# Patient Record
Sex: Female | Born: 1980 | Race: White | Hispanic: No | Marital: Single | Smoking: Current every day smoker
Health system: Southern US, Community
[De-identification: ages and names within clinical notes are randomized; demographics above are authoritative.]

## PROBLEM LIST (undated history)

## (undated) HISTORY — PX: TUBAL LIGATION: SHX77

---

## 2021-04-22 ENCOUNTER — Emergency Department: Payer: Medicaid Other

## 2021-04-22 ENCOUNTER — Other Ambulatory Visit: Payer: Self-pay

## 2021-04-22 ENCOUNTER — Emergency Department
Admission: EM | Admit: 2021-04-22 | Discharge: 2021-04-22 | Disposition: A | Discharge status: Home / Self Care | Payer: Medicaid Other | Attending: Student in an Organized Health Care Education/Training Program | Admitting: Student in an Organized Health Care Education/Training Program

## 2021-04-22 DIAGNOSIS — S42209A Unspecified fracture of upper end of unspecified humerus, initial encounter for closed fracture: Secondary | ICD-10-CM

## 2021-04-22 DIAGNOSIS — Y92828 Other wilderness area as the place of occurrence of the external cause: Secondary | ICD-10-CM | POA: Insufficient documentation

## 2021-04-22 DIAGNOSIS — W010XXA Fall on same level from slipping, tripping and stumbling without subsequent striking against object, initial encounter: Secondary | ICD-10-CM | POA: Insufficient documentation

## 2021-04-22 DIAGNOSIS — S42292A Other displaced fracture of upper end of left humerus, initial encounter for closed fracture: Secondary | ICD-10-CM | POA: Insufficient documentation

## 2021-04-22 HISTORY — DX: Unspecified fracture of upper end of unspecified humerus, initial encounter for closed fracture: S42.209A

## 2021-04-22 MED ORDER — OXYCODONE-ACETAMINOPHEN 5-325 MG PO TABS
1.0000 | ORAL_TABLET | Freq: Once | ORAL | Status: AC
  Administered 2021-04-22: 1 via ORAL

## 2021-04-22 MED ORDER — FENTANYL CITRATE (PF) 250 MCG/5ML IJ SOLN
100.0000 ug | Freq: Once | INTRAMUSCULAR | Status: AC
  Administered 2021-04-22: 100 ug via INTRAVENOUS

## 2021-04-22 MED ORDER — OXYCODONE HCL 5 MG PO TABS: 5.0000 mg | ORAL_TABLET | Freq: Three times a day (TID) | ORAL | 0 refills | Status: AC | PRN

## 2021-04-22 MED ORDER — NAPROXEN 500 MG PO TABS: 500.0000 mg | ORAL_TABLET | Freq: Two times a day (BID) | ORAL | 0 refills | Status: AC

## 2021-04-22 MED ORDER — METHOCARBAMOL 500 MG PO TABS: 500.0000 mg | ORAL_TABLET | Freq: Three times a day (TID) | ORAL | 0 refills | Status: AC | PRN

## 2021-04-22 NOTE — ED Notes (Signed)
Patient transported to X-ray 

## 2021-04-22 NOTE — ED Triage Notes (Signed)
Pt to ED for fall this am, states landed on left elbow. Fall d/t slipping on hill. Obvious deformity noted to left shoulder.

## 2021-04-22 NOTE — Discharge Instructions (Signed)
Call today to request a follow up with orthopedics.  Take the medications as prescribed. You may also take Colace stool softener to prevent constipation if taking the oxycodone.  Wear the immobilizer until follow-up with orthopedics.  Return to the emergency department for symptoms that change or worsen if you are unable to see orthopedics right away.

## 2021-04-22 NOTE — ED Provider Notes (Signed)
[facility] Emergency Department Provider Note ____________________________________________  Time seen: Approximately 10:30 AM  I have reviewed the triage vital signs and the nursing notes.   HISTORY  Chief Complaint Shoulder Injury    HPI Hayley Robertson is a 40 y.o. female who presents to the emergency department for evaluation and treatment of left shoulder pain after slipping on a hill and landed on elbow. Intense pain in shoulder immediately afterward. No previous shoulder dislocation.  History reviewed. No pertinent past medical history.  There are no problems to display for this patient.   History reviewed. No pertinent surgical history.  Prior to Admission medications   Medication Sig Start Date End Date Taking? Authorizing Provider  methocarbamol (ROBAXIN) 500 MG tablet Take 1 tablet (500 mg total) by mouth every 8 (eight) hours as needed for muscle spasms. 04/22/21  Yes Zong Mcquarrie B, FNP  naproxen (NAPROSYN) 500 MG tablet Take 1 tablet (500 mg total) by mouth 2 (two) times daily with a meal. 04/22/21  Yes Roberth Berling B, FNP  oxyCODONE (ROXICODONE) 5 MG immediate release tablet Take 1 tablet (5 mg total) by mouth every 8 (eight) hours as needed. 04/22/21 04/22/22 Yes Kenedie Dirocco, Kasandra Knudsen, FNP    Allergies Ceclor [cefaclor] and Tegretol [carbamazepine]  No family history on file.  Social History    Review of Systems Constitutional: Negative for fever. Cardiovascular: Negative for chest pain. Respiratory: Negative for shortness of breath. Musculoskeletal: Positive for shoulder pain and deformity. Skin: Negative for open wounds or lesions.  Neurological: Negative for decrease in sensation  ____________________________________________   PHYSICAL EXAM:  VITAL SIGNS: ED Triage Vitals [04/22/21 1022]  Enc Vitals Group     BP (!) 137/102     Pulse Rate 96     Resp 20     Temp 98.4 F (36.9 C)     Temp Source Oral     SpO2 97 %      Weight 130 lb (59 kg)     Height 5\' 8"  (1.727 m)     Head Circumference      Peak Flow      Pain Score 10     Pain Loc      Pain Edu?      Excl. in GC?     Constitutional: Alert and oriented. Well appearing and in no acute distress. Eyes: Conjunctivae are clear without discharge or drainage Head: Atraumatic Neck: Supple. No focal midline tenderness Respiratory: No cough. Respirations are even and unlabored. Musculoskeletal: Obvious anterior dislocation left shoulder Neurologic: Motor and sensory function left hand intact.  Skin: No open wounds over the left shoulder.   Psychiatric: Affect and behavior are appropriate.  ____________________________________________   LABS (all labs ordered are listed, but only abnormal results are displayed)  Labs Reviewed - No data to display ____________________________________________  RADIOLOGY  Image of the left shoulder shows a comminuted proximal humerus fracture just distal to the humeral neck  I, Nura Cahoon, personally viewed and evaluated these images (plain radiographs) as part of my medical decision making, as well as reviewing the written report by the radiologist.  DG Shoulder Left  Result Date: 04/22/2021 CLINICAL DATA:  40 year old who slipped and fell outside on a Hill earlier today, injuring the LEFT shoulder. EXAM: LEFT SHOULDER - 2+ VIEW COMPARISON:  None. FINDINGS: Comminuted fracture involving the proximal humerus at and just distal to the humeral neck. Glenohumeral joint anatomically aligned. Acromioclavicular joint anatomically aligned. IMPRESSION: Comminuted fracture involving the proximal humerus. Electronically  Signed   By: Hulan Saas M.D.   On: 04/22/2021 11:01   ____________________________________________   PROCEDURES  Procedures placed in shoulder immobilizer by nursing staff.  Neurovascularly intact after placement.  ____________________________________________   INITIAL IMPRESSION / ASSESSMENT  AND PLAN / ED COURSE  Hayley Robertson is a 40 y.o. who presents to the emergency department for treatment and evaluation of left shoulder pain after mechanical, nonsyncopal fall just prior to arrival.  See HPI for details.  Suspect anterior shoulder dislocation.  Will get imaging.  No dislocation.  She does have a comminuted proximal humerus fracture just distal to the humeral neck.  Case discussed with Dr.Poggi who was able to review the x-ray image and recommend shoulder immobilizer and outpatient follow-up.  Results were discussed with the patient.  Shoulder immobilizer applied and patient advised to wear this until Ortho follow-up.  Plan will be to have her call this afternoon to request a follow-up appointment.  Pain medications, anti-inflammatory, and muscle relaxer sent to patient's pharmacy.  She was encouraged to return to the emergency department if symptoms change or pain is uncontrolled despite medications.  Medications  fentaNYL citrate (PF) (SUBLIMAZE) injection 100 mcg (100 mcg Intravenous Given 04/22/21 1057)  oxyCODONE-acetaminophen (PERCOCET/ROXICET) 5-325 MG per tablet 1 tablet (1 tablet Oral Given 04/22/21 1211)    Pertinent labs & imaging results that were available during my care of the patient were reviewed by me and considered in my medical decision making (see chart for details).   _________________________________________   Final diagnoses:  Other closed displaced fracture of proximal end of left humerus, initial encounter    Initial fracture care was provided. Follow up will be greater than 24 hours.  ED Discharge Orders          Ordered    oxyCODONE (ROXICODONE) 5 MG immediate release tablet  Every 8 hours PRN        04/22/21 1142    naproxen (NAPROSYN) 500 MG tablet  2 times daily with meals        04/22/21 1142    methocarbamol (ROBAXIN) 500 MG tablet  Every 8 hours PRN        04/22/21 1142             If controlled substance prescribed during this  visit, 12 month history viewed on the [facility] prior to issuing an initial prescription for Schedule II or III opiod.    Chinita Pester, FNP 04/22/21 1234    Willy Eddy, MD 04/22/21 [phone]

## 2021-04-28 ENCOUNTER — Other Ambulatory Visit (HOSPITAL_COMMUNITY): Payer: Self-pay | Admitting: Orthopaedic Surgery

## 2021-04-28 ENCOUNTER — Other Ambulatory Visit: Payer: Self-pay | Admitting: Orthopaedic Surgery

## 2021-04-28 DIAGNOSIS — S42202A Unspecified fracture of upper end of left humerus, initial encounter for closed fracture: Secondary | ICD-10-CM

## 2021-04-29 ENCOUNTER — Other Ambulatory Visit: Payer: Self-pay

## 2021-04-29 ENCOUNTER — Ambulatory Visit
Admission: RE | Admit: 2021-04-29 | Discharge: 2021-04-29 | Disposition: A | Discharge status: Home / Self Care | Payer: BC Managed Care – PPO | Source: Ambulatory Visit | Attending: Orthopaedic Surgery | Admitting: Orthopaedic Surgery

## 2021-04-29 DIAGNOSIS — S42202A Unspecified fracture of upper end of left humerus, initial encounter for closed fracture: Secondary | ICD-10-CM | POA: Insufficient documentation

## 2021-08-22 ENCOUNTER — Emergency Department: Payer: BC Managed Care – PPO

## 2021-08-22 ENCOUNTER — Emergency Department
Admission: EM | Admit: 2021-08-22 | Discharge: 2021-08-22 | Disposition: A | Discharge status: Home / Self Care | Payer: BC Managed Care – PPO | Attending: Emergency Medicine | Admitting: Emergency Medicine

## 2021-08-22 ENCOUNTER — Encounter: Payer: Self-pay | Admitting: Emergency Medicine

## 2021-08-22 DIAGNOSIS — R112 Nausea with vomiting, unspecified: Secondary | ICD-10-CM | POA: Diagnosis present

## 2021-08-22 DIAGNOSIS — R1030 Lower abdominal pain, unspecified: Secondary | ICD-10-CM | POA: Insufficient documentation

## 2021-08-22 DIAGNOSIS — R197 Diarrhea, unspecified: Secondary | ICD-10-CM | POA: Insufficient documentation

## 2021-08-22 DIAGNOSIS — R001 Bradycardia, unspecified: Secondary | ICD-10-CM | POA: Insufficient documentation

## 2021-08-22 LAB — COMPREHENSIVE METABOLIC PANEL
ALT: 13 U/L (ref 0–44)
AST: 29 U/L (ref 15–41)
Albumin: 4 g/dL (ref 3.5–5.0)
Alkaline Phosphatase: 70 U/L (ref 38–126)
Anion gap: 13 (ref 5–15)
BUN: <5 mg/dL — ABNORMAL LOW (ref 6–20)
CO2: 21 mmol/L — ABNORMAL LOW (ref 22–32)
Calcium: 8.7 mg/dL — ABNORMAL LOW (ref 8.9–10.3)
Chloride: 105 mmol/L (ref 98–111)
Creatinine, Ser: 0.63 mg/dL (ref 0.44–1.00)
GFR, Estimated: >60 mL/min (ref >60)
Glucose, Bld: 134 mg/dL — ABNORMAL HIGH (ref 70–99)
Potassium: 3.3 mmol/L — ABNORMAL LOW (ref 3.5–5.1)
Sodium: 139 mmol/L (ref 135–145)
Total Bilirubin: 0.7 mg/dL (ref 0.3–1.2)
Total Protein: 6.6 g/dL (ref 6.5–8.1)

## 2021-08-22 LAB — CBC WITH DIFFERENTIAL/PLATELET
Abs Immature Granulocytes: 0.06 10*3/uL (ref 0.00–0.07)
Basophils Absolute: 0.1 10*3/uL (ref 0.0–0.1)
Basophils Relative: 1 %
Eosinophils Absolute: 0.3 10*3/uL (ref 0.0–0.5)
Eosinophils Relative: 2 %
HCT: 44.4 % (ref 36.0–46.0)
Hemoglobin: 15.2 g/dL — ABNORMAL HIGH (ref 12.0–15.0)
Immature Granulocytes: 1 %
Lymphocytes Relative: 21 %
Lymphs Abs: 2.3 10*3/uL (ref 0.7–4.0)
MCH: 31.9 pg (ref 26.0–34.0)
MCHC: 34.2 g/dL (ref 30.0–36.0)
MCV: 93.1 fL (ref 80.0–100.0)
Monocytes Absolute: 0.8 10*3/uL (ref 0.1–1.0)
Monocytes Relative: 7 %
Neutro Abs: 7.5 10*3/uL (ref 1.7–7.7)
Neutrophils Relative %: 68 %
Platelets: 272 10*3/uL (ref 150–400)
RBC: 4.77 MIL/uL (ref 3.87–5.11)
RDW: 14 % (ref 11.5–15.5)
WBC: 11 10*3/uL — ABNORMAL HIGH (ref 4.0–10.5)
nRBC: 0 % (ref 0.0–0.2)

## 2021-08-22 LAB — LIPASE, BLOOD: Lipase: 36 U/L (ref 11–51)

## 2021-08-22 LAB — URINALYSIS, MICROSCOPIC (REFLEX)

## 2021-08-22 LAB — URINALYSIS, ROUTINE W REFLEX MICROSCOPIC
Bilirubin Urine: NEGATIVE
Glucose, UA: NEGATIVE mg/dL
Ketones, ur: 15 mg/dL — AB
Leukocytes,Ua: NEGATIVE
Nitrite: NEGATIVE
Protein, ur: 30 mg/dL — AB
Specific Gravity, Urine: 1.015 (ref 1.005–1.030)
pH: >9 — ABNORMAL HIGH (ref 5.0–8.0)

## 2021-08-22 LAB — PREGNANCY, URINE: Preg Test, Ur: NEGATIVE

## 2021-08-22 MED ORDER — ONDANSETRON 4 MG PO TBDP: ORAL_TABLET | ORAL | 0 refills | Status: DC

## 2021-08-22 MED ORDER — LACTATED RINGERS IV BOLUS
1000.0000 mL | Freq: Once | INTRAVENOUS | Status: AC
Start: 2021-08-22 — End: 2021-08-22
  Administered 2021-08-22: 1000 mL via INTRAVENOUS

## 2021-08-22 MED ORDER — DROPERIDOL 2.5 MG/ML IJ SOLN
2.5000 mg | Freq: Once | INTRAMUSCULAR | Status: AC
  Administered 2021-08-22: 2.5 mg via INTRAVENOUS
  Filled 2021-08-22: qty 2

## 2021-08-22 MED ORDER — IOHEXOL 300 MG/ML  SOLN
100.0000 mL | Freq: Once | INTRAMUSCULAR | Status: AC | PRN
  Administered 2021-08-22: 100 mL via INTRAVENOUS

## 2021-08-22 NOTE — ED Triage Notes (Signed)
41 y/o female arrived to the [facility] by EMS with a CC of lower R and L abd cramping pain that started tonight. As well as N/V/D. PT is A&Ox4 ? ? ? ?

## 2021-08-22 NOTE — Discharge Instructions (Signed)

## 2021-08-22 NOTE — ED Provider Notes (Signed)
? ?[facility] ?Provider Note ? ? ? Event Date/Time  ? First MD Initiated Contact with Patient 08/22/21 0206   ?  (approximate) ? ? ?History  ? ?Abdominal Pain, Emesis, and Diarrhea ? ? ?HPI ? ?Hayley Robertson is a 41 y.o. female with no contributory past medical history who presents for evaluation of a cute onset of diarrhea followed relatively quickly by nausea/vomiting and abdominal pain.  She reports that the symptoms started around 10:00 PM and have persisted and worsened to the point that all of her symptoms are severe.  She reports multiple episodes of both diarrhea and vomiting with persistent nausea.  She said that she is having sharp pains in her lower abdomen center, not localized to either side. ? ?She denies recent changes to her urinary symptoms including no recent increased urinary frequency and no dysuria.  She is status post bilateral tubal ligation.  No known exposure to bad food.  No recent fever, chest pain, nor shortness of breath. ?  ? ? ?Physical Exam  ? ?Triage Vital Signs: ?ED Triage Vitals [08/22/21 0157]  ?Enc Vitals Group  ?   BP (!) 139/101  ?   Pulse Rate 66  ?   Resp 20  ?   Temp (!) 97.5 ?F (36.4 ?C)  ?   Temp Source Oral  ?   SpO2 100 %  ?   Weight   ?   Height   ?   Head Circumference   ?   Peak Flow   ?   Pain Score   ?   Pain Loc   ?   Pain Edu?   ?   Excl. in GC?   ? ? ?Most recent vital signs: ?Vitals:  ? 08/22/21 0300 08/22/21 0535  ?BP: 122/70 119/71  ?Pulse: (!) 54 64  ?Resp: 18 20  ?Temp:    ?SpO2: 100% 100%  ? ? ? ?General: Awake, appears very uncomfortable, cannot find a position of comfort in bed. ?CV:  Good peripheral perfusion.  Normal heart sounds. ?Resp:  Normal effort.  Lungs are clear to auscultation bilaterally.  No wheezing. ?Abd:  No distention.  Vomiting in the emergency department.  Mild nonlocalized tenderness to palpation throughout the abdomen with no rebound or guarding, no evidence of peritonitis.  No specific tenderness localized to  either adnexa. ? ? ?ED Results / Procedures / Treatments  ? ?Labs ?(all labs ordered are listed, but only abnormal results are displayed) ?Labs Reviewed  ?COMPREHENSIVE METABOLIC PANEL - Abnormal; Notable for the following components:  ?    Result Value  ? Potassium 3.3 (*)   ? CO2 21 (*)   ? Glucose, Bld 134 (*)   ? BUN <5 (*)   ? Calcium 8.7 (*)   ? All other components within normal limits  ?CBC WITH DIFFERENTIAL/PLATELET - Abnormal; Notable for the following components:  ? WBC 11.0 (*)   ? Hemoglobin 15.2 (*)   ? All other components within normal limits  ?URINALYSIS, ROUTINE W REFLEX MICROSCOPIC - Abnormal; Notable for the following components:  ? pH >9.0 (*)   ? Hgb urine dipstick TRACE (*)   ? Ketones, ur 15 (*)   ? Protein, ur 30 (*)   ? All other components within normal limits  ?URINALYSIS, MICROSCOPIC (REFLEX) - Abnormal; Notable for the following components:  ? Bacteria, UA RARE (*)   ? All other components within normal limits  ?LIPASE, BLOOD  ?PREGNANCY, URINE  ? ? ?RADIOLOGY ?  No indication of acute abnormality on CT abdomen/pelvis with IV contrast ? ? ? ?PROCEDURES: ? ?Critical Care performed: No ? ?.1-3 Lead EKG Interpretation ?Performed by: Loleta Rose, MD ?Authorized by: Loleta Rose, MD  ? ?  Interpretation: abnormal   ?  ECG rate:  55 ?  ECG rate assessment: bradycardic   ?  Rhythm: sinus bradycardia   ?  Ectopy: none   ?  Conduction: normal   ? ? ?MEDICATIONS ORDERED IN ED: ?Medications  ?droperidol (INAPSINE) 2.5 MG/ML injection 2.5 mg (2.5 mg Intravenous Given 08/22/21 0225)  ?lactated ringers bolus 1,000 mL (0 mLs Intravenous Stopped 08/22/21 0617)  ?iohexol (OMNIPAQUE) 300 MG/ML solution 100 mL (100 mLs Intravenous Contrast Given 08/22/21 0426)  ? ? ? ?IMPRESSION / MDM / ASSESSMENT AND PLAN / ED COURSE  ?I reviewed the triage vital signs and the nursing notes. ?             ?               ? ?Differential diagnosis includes, but is not limited to, viral GI illness, bacterial GI illness,  appendicitis, diverticulitis, ovarian torsion, STD/PID. ? ?The history of present illness and current presentation is most consistent with an acute onset and severe viral GI illness.  However I cannot rule out an intra-abdominal infection such as appendicitis.  Patient is obviously very uncomfortable.  To help with both the nausea/vomiting and the pain, I will give droperidol 2.5 mg IV.  Patient will get 1 L LR IV bolus.  She is on the cardiac monitor to evaluate for rate or rhythm changes.  No indication for EKG. ? ?I reviewed her vital signs and they are stable and within normal limits other than some mild bradycardia.  She is afebrile and normotensive. ? ?Labs ordered include CBC with differential, comprehensive metabolic panel, lipase, urinalysis.  Initially ordered a pregnancy test but the patient advised me she is status post tubal ligation so there is no indication for pregnancy test.  Patient understands and agrees with the plan. ? ? ? ?Clinical Course as of 08/22/21 0624  ?Fri Aug 22, 2021  ?0335 I personally reviewed the patient's lab results.  Her comprehensive metabolic panel is essentially normal other than a slightly decreased potassium, likely secondary to GI losses.  Lipase is normal.  Urinalysis shows some hemoglobinuria and a few ketones, no other significant findings.  CBC is essentially normal except that she does have very mild leukocytosis of 11. ? ?Given the constellation of symptoms that includes lower abdominal pain, nausea, vomiting, and diarrhea, and the severity with which it hit her, I will proceed with CT scan of the abdomen and pelvis to rule out acute infection such as diverticulitis or appendicitis. [CF]  ?0507 CT ABDOMEN PELVIS W CONTRAST ?I personally reviewed the CT scan of the patient.  I see no acute abnormalities.  The radiologist also did not identify any acute abnormality in the abdomen and pelvis to explain the patient's symptoms.  She is currently sleeping, resting  comfortably. [CF]  ?0102 Patient has been sleeping but now feels much better.  She said her stomach feels a little bit "queasy" but is no longer painful and she no longer has any overt nausea.  She has not vomited.  She is comfortable with the plan for discharge and outpatient follow-up.  I gave my usual and customary return precautions. [CF]  ?  ?Clinical Course User Index ?[CF] Loleta Rose, MD  ? ? ? ?FINAL CLINICAL  IMPRESSION(S) / ED DIAGNOSES  ? ?Final diagnoses:  ?Nausea vomiting and diarrhea  ?Lower abdominal pain  ? ? ? ?Rx / DC Orders  ? ?ED Discharge Orders   ? ?      Ordered  ?  ondansetron (ZOFRAN-ODT) 4 MG disintegrating tablet       ? 08/22/21 2563  ? ?  ?  ? ?  ? ? ? ?Note:  This document was prepared using Dragon voice recognition software and may include unintentional dictation errors. ?  ?Loleta Rose, MD ?08/22/21 [phone] ? ?

## 2022-10-20 NOTE — Progress Notes (Signed)
 Patient ID: Hayley Robertson is a 42 y.o. female who presents for new concerns of tick bite   Informant: Patient came to appointment alone.  Assessment/Plan:   Tick Bite  Given that she is within the 72 hour period will provide doxycyline for tick bite prophylaxis. Advised that if she develops systemic signs (fever, muscle aches, joint pains, rash) or signs of cellulitiis of the bite area to reach out and may need extended course of antibiotics. She states that she normally gets yeast infections with anitbiotics and would like diflucan in case she gets a yeast infection.  -doxycyline 200 mg once  - diflucan 150 mg      Subjective:    HPI  42 year old woman who presents to discuss a tick bite Found the tick on Saturday. Removed the tick, washed and cleansed the area. Thinks that it was a deer tick. No fevers, joint pains or muscle aches. No systemic signs. Area was iriritated and red has been improving but still with some irritation. Normally is very sensitive to insect bites and venom.     Objective:    Vital Signs BP 124/78 (BP Site: L Arm, BP Position: Sitting, BP Cuff Size: Medium)   Pulse 62   Wt 55.8 kg (123 lb)   SpO2 98%   BMI 18.70 kg/m    Exam General: NAD EYES: Anicteric sclerae. RESP: Relaxed respiratory effort CV: warm and well perfused  MSK: No focal muscle tenderness. SKIN: Appropriately warm and moist. Erythematous non tender non warm area around a central bite on the inner left thigh.  NEURO: Stable gait and coordination.

## 2023-04-30 IMAGING — CT CT SHOULDER*L* W/O CM
1 series · 12 of 14 positions shown, 15 images · non-contrast
Comparison: Radiographs 04/22/2021

CLINICAL DATA: Left humeral neck fracture.

EXAM:
CT OF THE UPPER LEFT EXTREMITY WITHOUT CONTRAST
TECHNIQUE: Multidetector CT imaging of the upper left extremity was performed
according to the standard protocol.

[Series 7: ax st · axial · 0.27mm/px · z∈[-638,-487]mm · 12 of 92 slices shown, 15 images]
[im 8/92  soft-tissue]
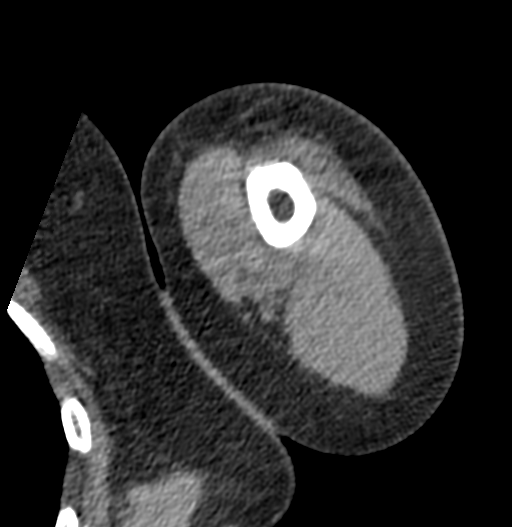
[im 8/92  bone]
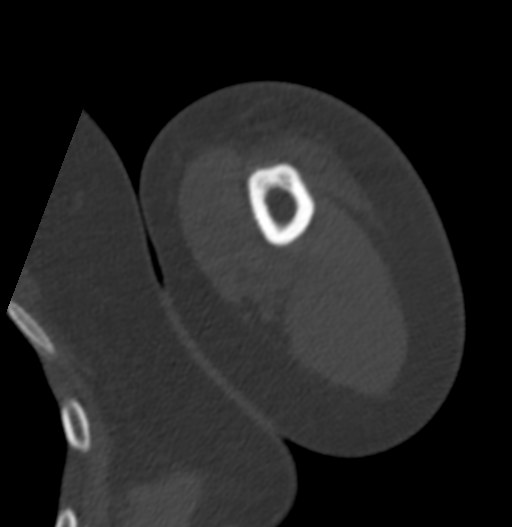
[im 15/92  bone]
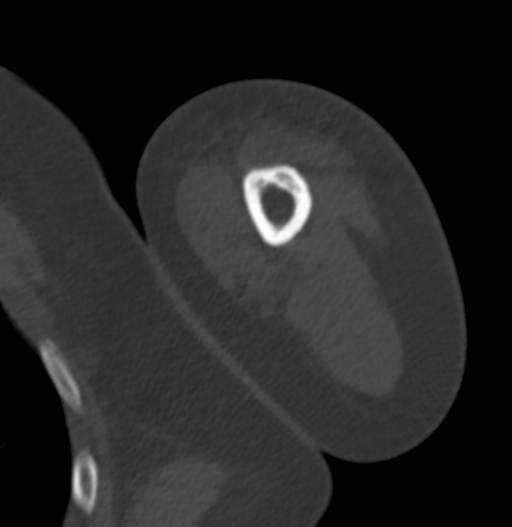
[im 22/92  bone]
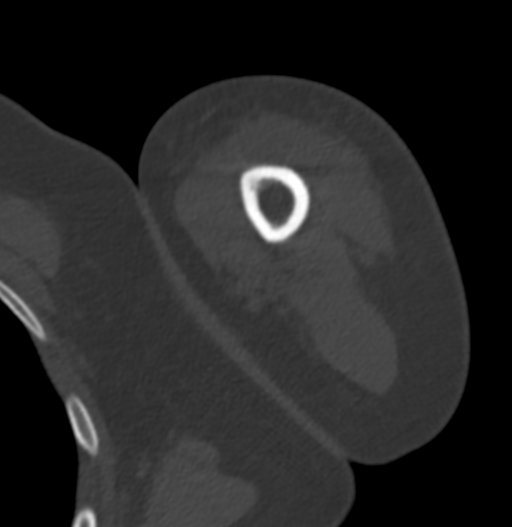
[im 29/92  bone]
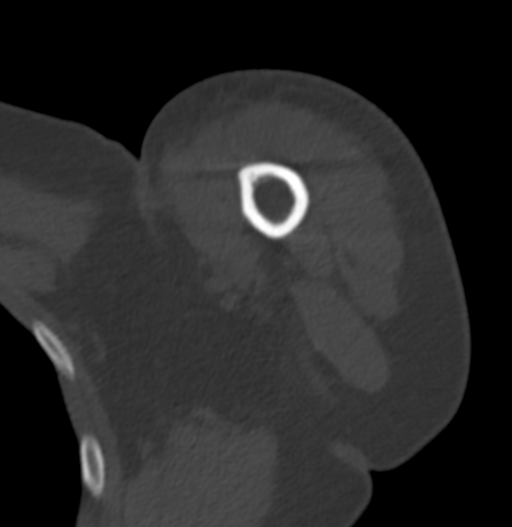
[im 36/92  soft-tissue]
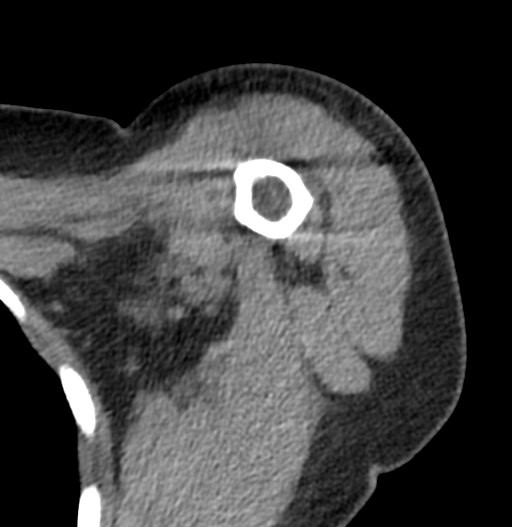
[im 36/92  bone]
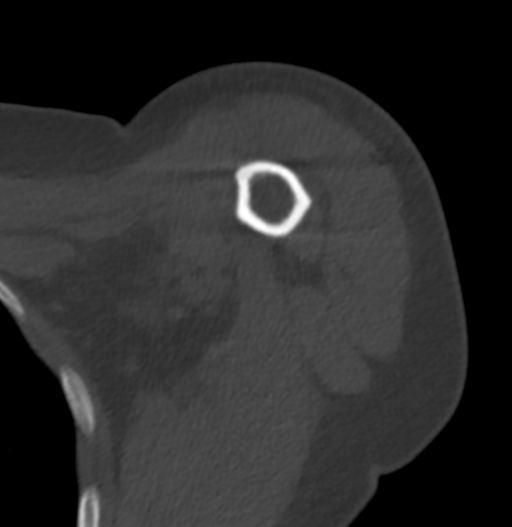
[im 43/92  bone]
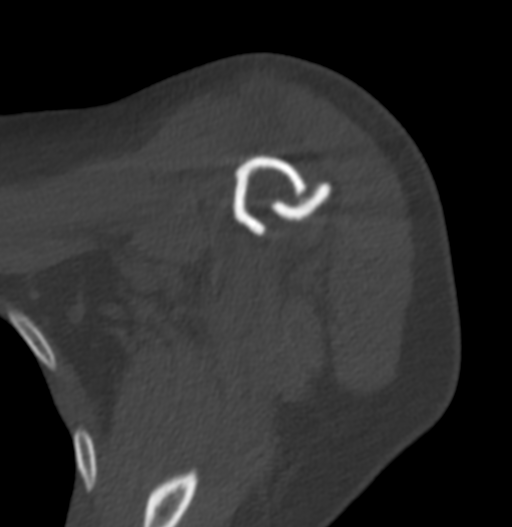
[im 50/92  bone]
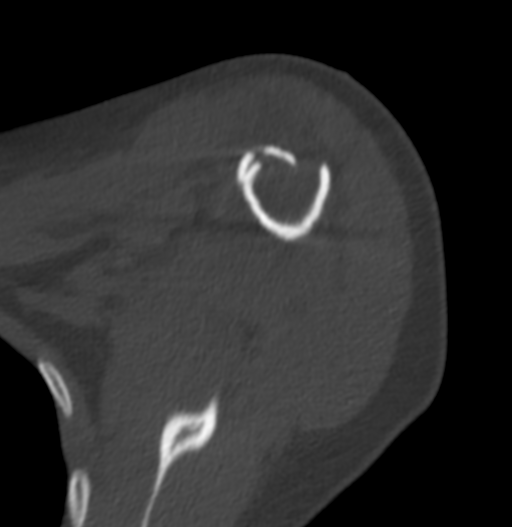
[im 57/92  bone]
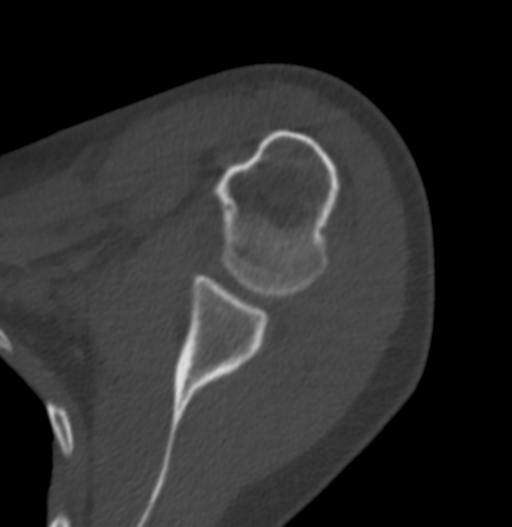
[im 64/92  soft-tissue]
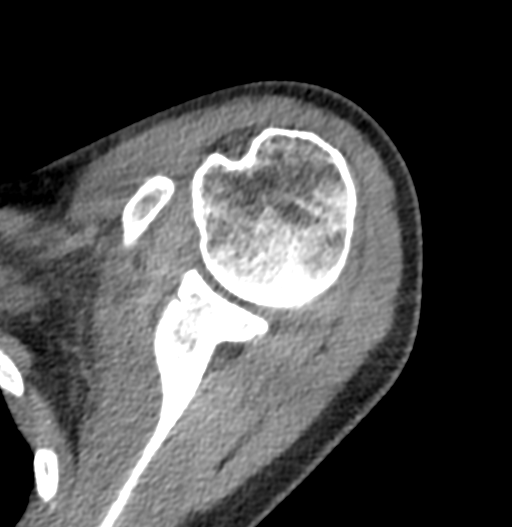
[im 64/92  bone]
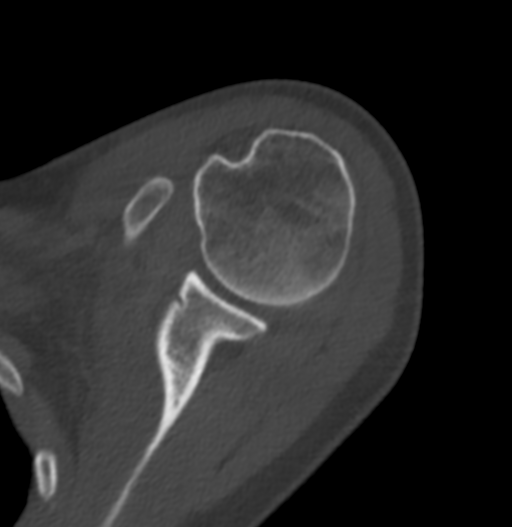
[im 71/92  bone]
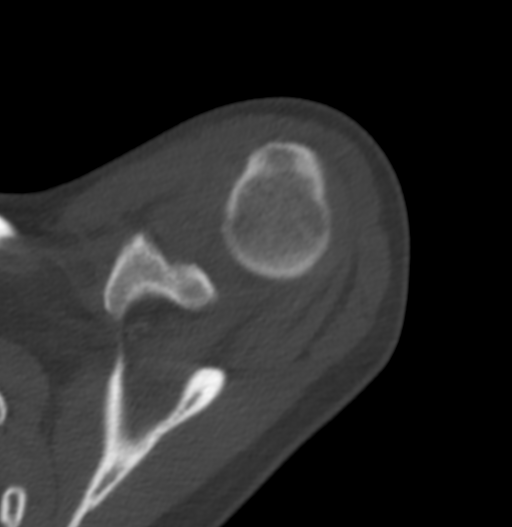
[im 78/92  bone]
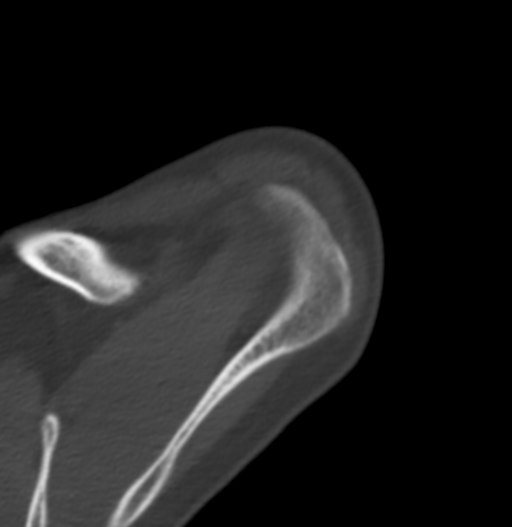
[im 85/92  bone]
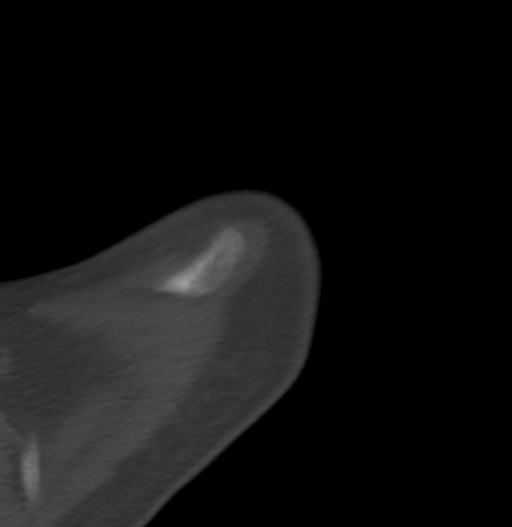

[12 of 14 positions shown; findings below may reference images not displayed]

FINDINGS: There is a mildly displaced oblique coursing fracture through the
humeral neck/upper humeral shaft. Maximum displacement is 11 mm. No
associated longitudinal fracture of the greater tuberosity and the
lesser tuberosity is intact.

The glenohumeral joint is maintained. The AC joint is intact. No
scapular or left rib fractures.

Grossly by CT the rotator cuff tendons are intact.

The shoulder musculature is grossly normal.
IMPRESSION: 1. Mildly displaced oblique coursing fracture through the humeral
neck/upper humeral shaft.
2. The glenohumeral joint is maintained.
3. Grossly by CT the rotator cuff tendons are intact.

## 2023-08-23 IMAGING — CT CT ABD-PELV W/ CM
2 of 5 series · 16 of 46 positions shown, 18 images · IV contrast (APPLIED)
Comparison: None.

CLINICAL DATA: 40-year-old female with abdominal pain, diarrhea,
nausea vomiting.

EXAM:
CT ABDOMEN AND PELVIS WITH CONTRAST
TECHNIQUE: Multidetector CT imaging of the abdomen and pelvis was performed
using the standard protocol following bolus administration of
intravenous contrast.

[Series 2: routine abd/pel with · axial · 0.88mm/px · z∈[-1029,-584]mm · 13 of 101 slices shown, 15 images]
[im 6/101  soft-tissue]
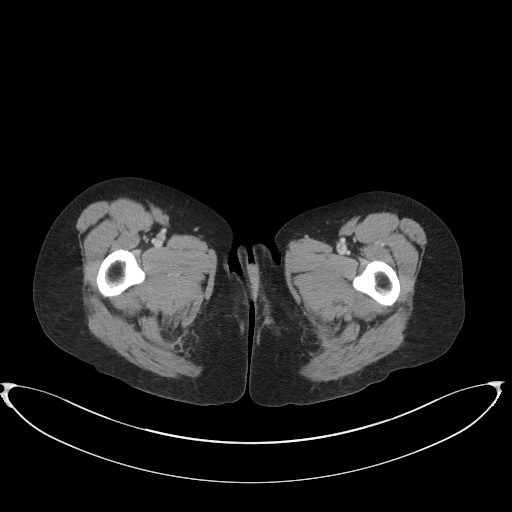
[im 6/101  bone]
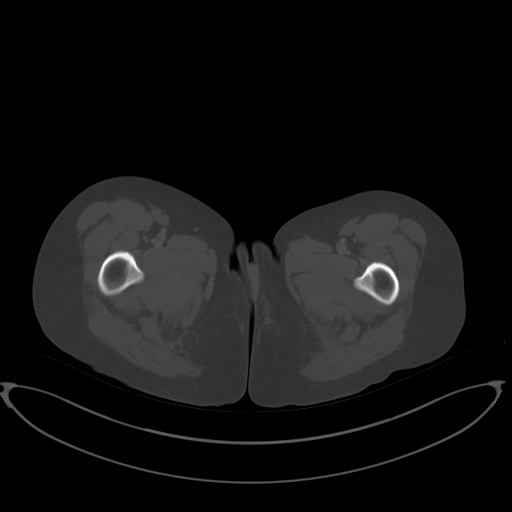
[im 16/101  soft-tissue]
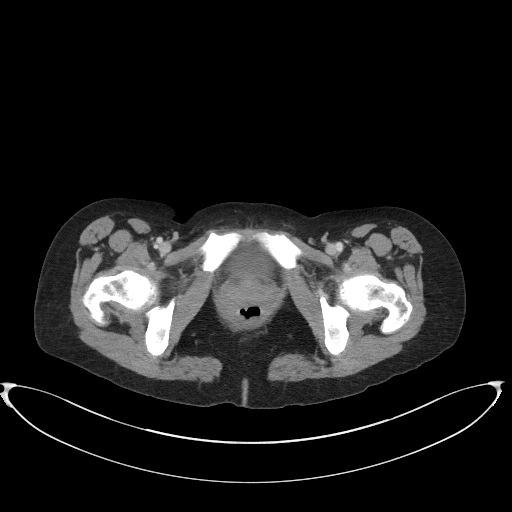
[im 22/101  soft-tissue]
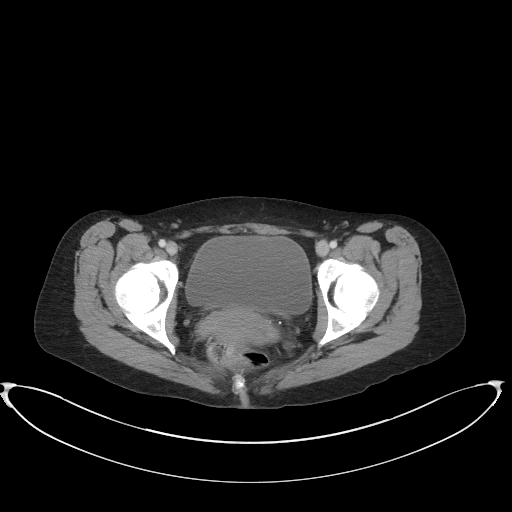
[im 27/101  soft-tissue]
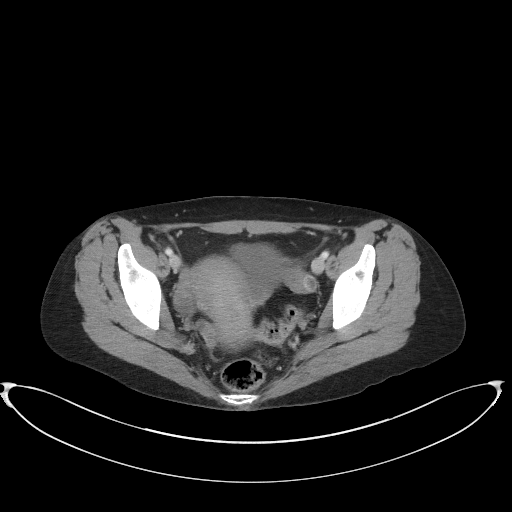
[im 37/101  soft-tissue]
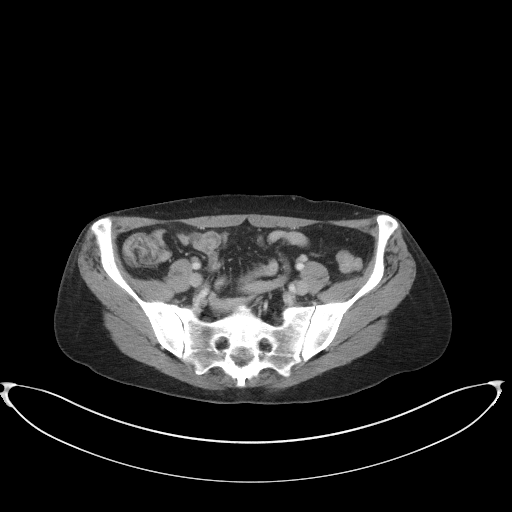
[im 43/101  soft-tissue]
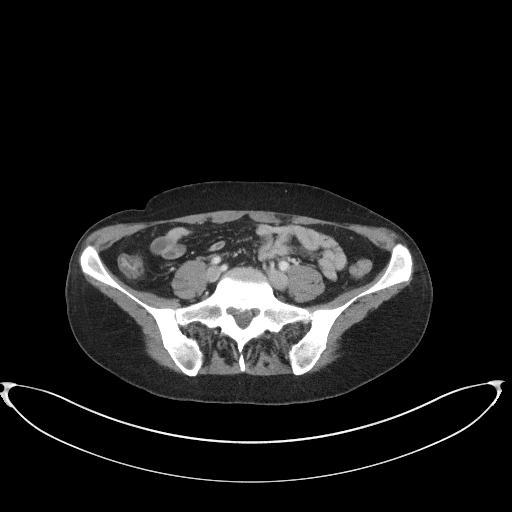
[im 53/101  soft-tissue]
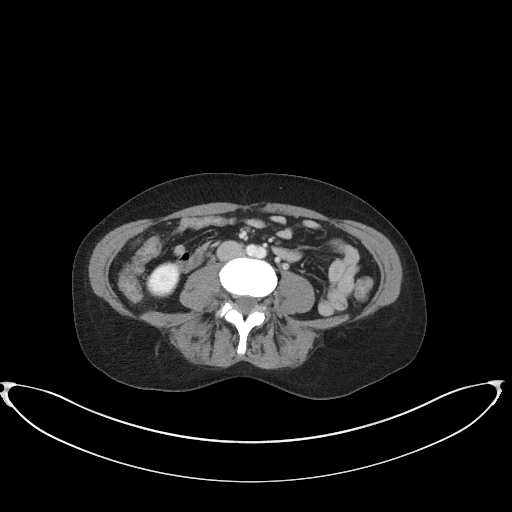
[im 58/101  soft-tissue]
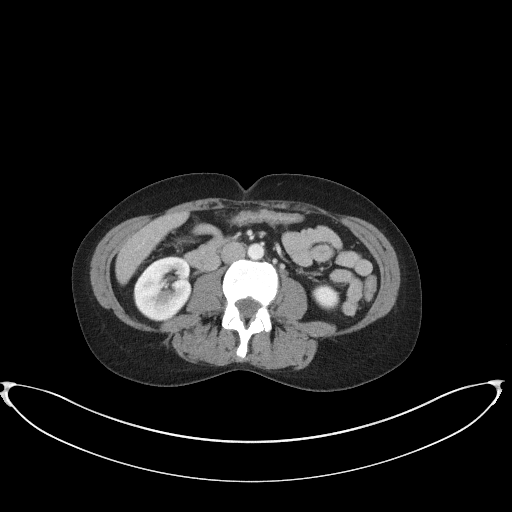
[im 64/101  soft-tissue]
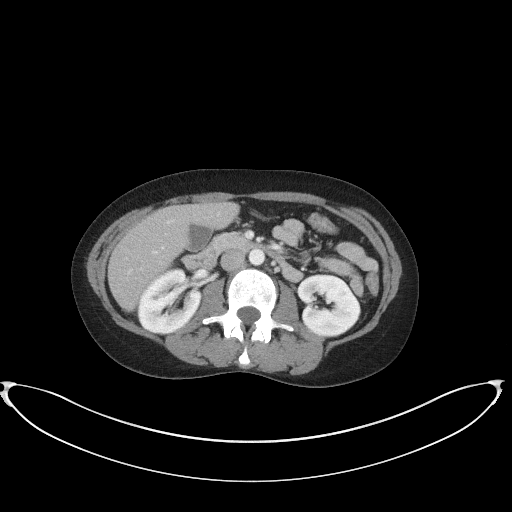
[im 64/101  bone]
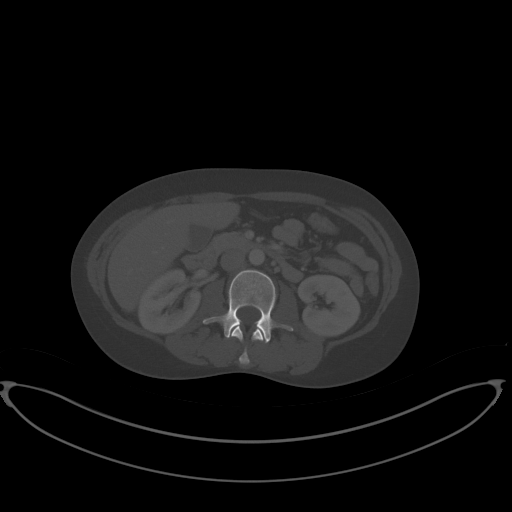
[im 74/101  soft-tissue]
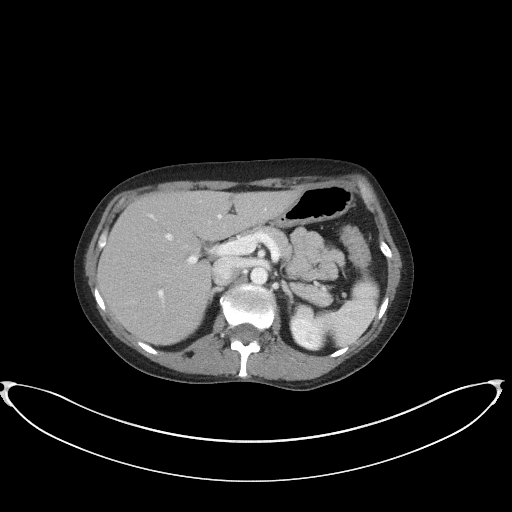
[im 79/101  soft-tissue]
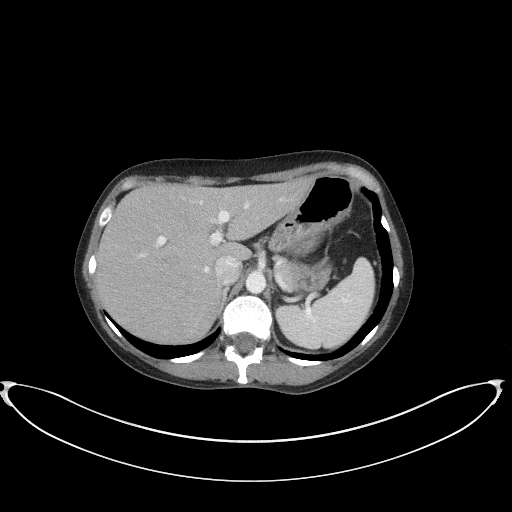
[im 85/101  soft-tissue]
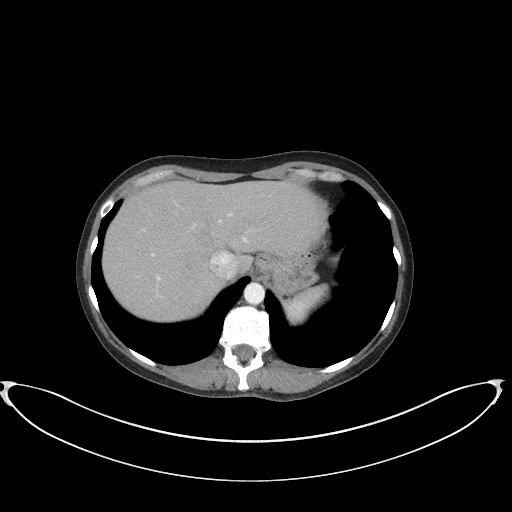
[im 95/101  soft-tissue]
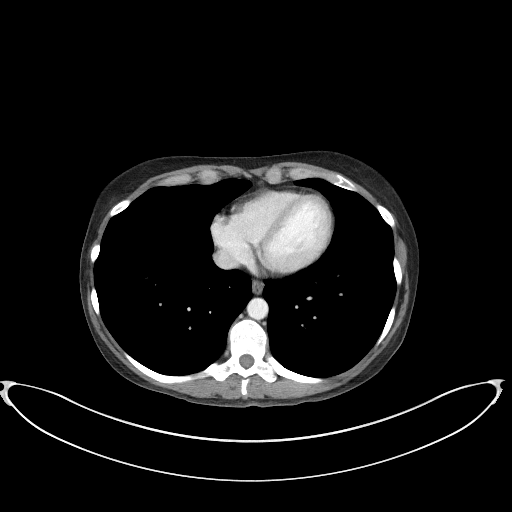

[Series 5: coronal st · coronal · 0.75mm/px · 3 of 81 slices shown]
[im 27/81  soft-tissue]
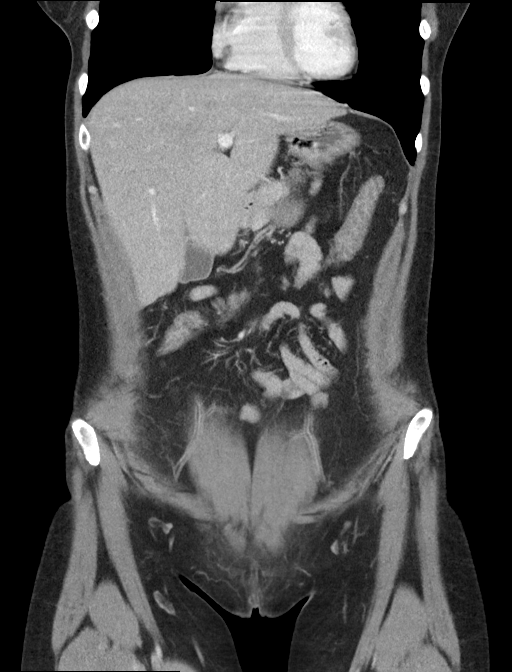
[im 36/81  soft-tissue]
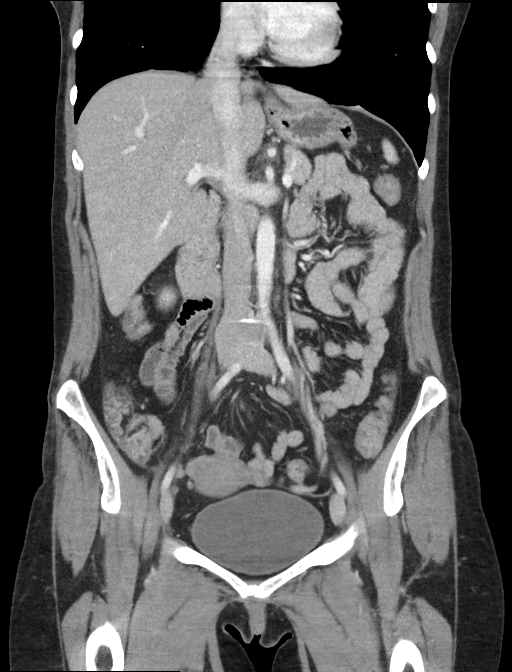
[im 45/81  soft-tissue]
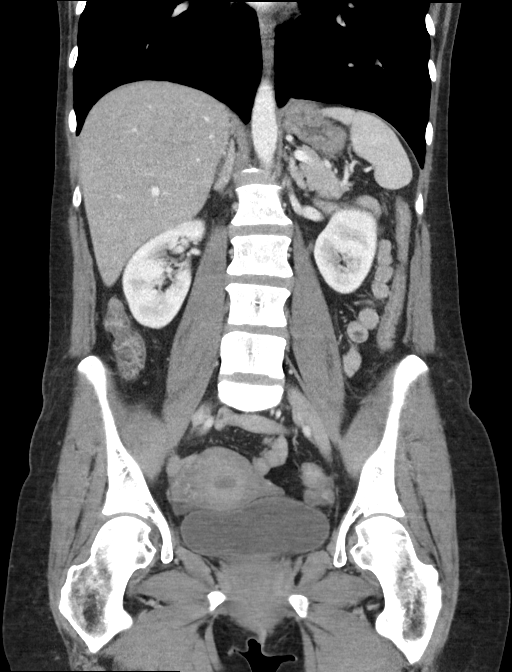

[16 of 46 positions shown; findings below may reference images not displayed]

RADIATION DOSE REDUCTION: This exam was performed according to the
departmental dose-optimization program which includes automated
exposure control, adjustment of the mA and/or kV according to
patient size and/or use of iterative reconstruction technique.

CONTRAST:  100mL OMNIPAQUE IOHEXOL 300 MG/ML  SOLN
FINDINGS: Lower chest: Lung bases appear hyperinflated but otherwise negative.
No cardiomegaly, pericardial effusion or pleural effusion.

Hepatobiliary: Liver and gallbladder are within normal limits; focal
steatosis suspected along the falciform ligament.

Pancreas: Negative.

Spleen: Negative.

Adrenals/Urinary Tract: Normal adrenal glands. Nonobstructed kidneys
enhance and excrete contrast symmetrically. No nephrolithiasis or
renal inflammation. Decompressed ureters. Unremarkable bladder.

Stomach/Bowel: Decompressed colon throughout the abdomen and pelvis.
No convincing large bowel inflammation. Normal appendix on coronal
image 34. Terminal ileum and small bowel loops also decompressed.
Stomach also fairly decompressed. Negative duodenum. No free air,
free fluid, or mesenteric inflammation identified.

Vascular/Lymphatic: Major arterial structures in the abdomen and
pelvis are patent. Minimal atherosclerosis at the distal aorta.
Portal venous system is patent. No lymphadenopathy.

Reproductive: Physiologic appearance, negative.

Other: No pelvic free fluid. Multiple mostly left side pelvic
phleboliths.

Musculoskeletal: Several benign right hemipelvis bone islands
suspected. No acute osseous abnormality identified.
IMPRESSION: No acute or inflammatory process identified in the abdomen or
pelvis. Normal appendix.
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
## 2024-02-12 ENCOUNTER — Emergency Department: Admission: EM | Admit: 2024-02-12 | Discharge: 2024-02-12 | Disposition: A | Discharge status: Home / Self Care

## 2024-02-12 ENCOUNTER — Other Ambulatory Visit: Payer: Self-pay

## 2024-02-12 ENCOUNTER — Emergency Department

## 2024-02-12 DIAGNOSIS — R197 Diarrhea, unspecified: Secondary | ICD-10-CM | POA: Diagnosis not present

## 2024-02-12 DIAGNOSIS — R112 Nausea with vomiting, unspecified: Secondary | ICD-10-CM | POA: Insufficient documentation

## 2024-02-12 DIAGNOSIS — R1084 Generalized abdominal pain: Secondary | ICD-10-CM | POA: Insufficient documentation

## 2024-02-12 DIAGNOSIS — F1210 Cannabis abuse, uncomplicated: Secondary | ICD-10-CM | POA: Insufficient documentation

## 2024-02-12 DIAGNOSIS — Z79899 Other long term (current) drug therapy: Secondary | ICD-10-CM | POA: Insufficient documentation

## 2024-02-12 DIAGNOSIS — E861 Hypovolemia: Secondary | ICD-10-CM | POA: Insufficient documentation

## 2024-02-12 DIAGNOSIS — F17200 Nicotine dependence, unspecified, uncomplicated: Secondary | ICD-10-CM | POA: Insufficient documentation

## 2024-02-12 LAB — URINALYSIS, ROUTINE W REFLEX MICROSCOPIC
Bilirubin Urine: NEGATIVE
Glucose, UA: NEGATIVE mg/dL
Hgb urine dipstick: NEGATIVE
Ketones, ur: 5 mg/dL — AB
Leukocytes,Ua: NEGATIVE
Nitrite: NEGATIVE
Protein, ur: NEGATIVE mg/dL
Specific Gravity, Urine: 1.008 (ref 1.005–1.030)
pH: 9 — ABNORMAL HIGH (ref 5.0–8.0)

## 2024-02-12 LAB — CBC
HCT: 42.3 % (ref 36.0–46.0)
Hemoglobin: 14.8 g/dL (ref 12.0–15.0)
MCH: 34.6 pg — ABNORMAL HIGH (ref 26.0–34.0)
MCHC: 35 g/dL (ref 30.0–36.0)
MCV: 98.8 fL (ref 80.0–100.0)
Platelets: 308 K/uL (ref 150–400)
RBC: 4.28 MIL/uL (ref 3.87–5.11)
RDW: 14.5 % (ref 11.5–15.5)
WBC: 17.1 K/uL — ABNORMAL HIGH (ref 4.0–10.5)
nRBC: 0 % (ref 0.0–0.2)

## 2024-02-12 LAB — LACTIC ACID, PLASMA
Lactic Acid, Venous: 3.6 mmol/L (ref 0.5–1.9)
Lactic Acid, Venous: 3.1 mmol/L (ref 0.5–1.9)
Lactic Acid, Venous: 1.6 mmol/L (ref 0.5–1.9)

## 2024-02-12 LAB — MAGNESIUM: Magnesium: 1.3 mg/dL — ABNORMAL LOW (ref 1.7–2.4)

## 2024-02-12 LAB — URINE DRUG SCREEN, QUALITATIVE (ARMC ONLY)
Amphetamines, Ur Screen: NOT DETECTED
Barbiturates, Ur Screen: NOT DETECTED
Benzodiazepine, Ur Scrn: NOT DETECTED
Cannabinoid 50 Ng, Ur ~~LOC~~: POSITIVE — AB
Cocaine Metabolite,Ur ~~LOC~~: NOT DETECTED
MDMA (Ecstasy)Ur Screen: NOT DETECTED
Methadone Scn, Ur: NOT DETECTED
Opiate, Ur Screen: NOT DETECTED
Phencyclidine (PCP) Ur S: NOT DETECTED
Tricyclic, Ur Screen: NOT DETECTED

## 2024-02-12 LAB — COMPREHENSIVE METABOLIC PANEL WITH GFR
ALT: 14 U/L (ref 0–44)
AST: 38 U/L (ref 15–41)
Albumin: 3.6 g/dL (ref 3.5–5.0)
Alkaline Phosphatase: 64 U/L (ref 38–126)
Anion gap: 16 — ABNORMAL HIGH (ref 5–15)
BUN: <5 mg/dL — ABNORMAL LOW (ref 6–20)
CO2: 21 mmol/L — ABNORMAL LOW (ref 22–32)
Calcium: 8.8 mg/dL — ABNORMAL LOW (ref 8.9–10.3)
Chloride: 103 mmol/L (ref 98–111)
Creatinine, Ser: 0.64 mg/dL (ref 0.44–1.00)
GFR, Estimated: >60 mL/min (ref >60)
Glucose, Bld: 132 mg/dL — ABNORMAL HIGH (ref 70–99)
Potassium: 3.1 mmol/L — ABNORMAL LOW (ref 3.5–5.1)
Sodium: 140 mmol/L (ref 135–145)
Total Bilirubin: 0.8 mg/dL (ref 0.0–1.2)
Total Protein: 6.5 g/dL (ref 6.5–8.1)

## 2024-02-12 LAB — LIPASE, BLOOD: Lipase: 42 U/L (ref 11–51)

## 2024-02-12 LAB — ETHANOL: Alcohol, Ethyl (B): <15 mg/dL (ref <15)

## 2024-02-12 LAB — PREGNANCY, URINE: Preg Test, Ur: NEGATIVE

## 2024-02-12 MED ORDER — MAGNESIUM SULFATE 2 GM/50ML IV SOLN
2.0000 g | Freq: Once | INTRAVENOUS | Status: AC
  Administered 2024-02-12: 2 g via INTRAVENOUS
  Filled 2024-02-12: qty 50

## 2024-02-12 MED ORDER — FOLIC ACID 1 MG PO TABS
1.0000 mg | ORAL_TABLET | Freq: Once | ORAL | Status: AC
  Administered 2024-02-12: 1 mg via ORAL
  Filled 2024-02-12: qty 1

## 2024-02-12 MED ORDER — DROPERIDOL 2.5 MG/ML IJ SOLN
1.2500 mg | Freq: Once | INTRAMUSCULAR | Status: AC
  Administered 2024-02-12: 1.25 mg via INTRAVENOUS
  Filled 2024-02-12: qty 2

## 2024-02-12 MED ORDER — IOHEXOL 300 MG/ML  SOLN
100.0000 mL | Freq: Once | INTRAMUSCULAR | Status: AC | PRN
  Administered 2024-02-12: 100 mL via INTRAVENOUS

## 2024-02-12 MED ORDER — ADULT MULTIVITAMIN W/MINERALS CH
1.0000 | ORAL_TABLET | Freq: Once | ORAL | Status: AC
Start: 2024-02-12 — End: 2024-02-12
  Administered 2024-02-12: 1 via ORAL
  Filled 2024-02-12: qty 1

## 2024-02-12 MED ORDER — ONDANSETRON HCL 4 MG/2ML IJ SOLN
4.0000 mg | Freq: Once | INTRAMUSCULAR | Status: AC
Start: 2024-02-12 — End: 2024-02-12
  Administered 2024-02-12: 4 mg via INTRAVENOUS
  Filled 2024-02-12: qty 2

## 2024-02-12 MED ORDER — SODIUM CHLORIDE 0.9 % IV BOLUS
1000.0000 mL | Freq: Once | INTRAVENOUS | Status: AC
  Administered 2024-02-12: 1000 mL via INTRAVENOUS

## 2024-02-12 MED ORDER — DIPHENHYDRAMINE HCL 50 MG/ML IJ SOLN
12.5000 mg | Freq: Once | INTRAMUSCULAR | Status: AC
  Administered 2024-02-12: 12.5 mg via INTRAVENOUS
  Filled 2024-02-12: qty 1

## 2024-02-12 MED ORDER — DIAZEPAM 5 MG/ML IJ SOLN
2.5000 mg | Freq: Once | INTRAMUSCULAR | Status: AC
  Administered 2024-02-12: 2.5 mg via INTRAVENOUS
  Filled 2024-02-12: qty 2

## 2024-02-12 MED ORDER — THIAMINE HCL 100 MG PO TABS
100.0000 mg | ORAL_TABLET | Freq: Once | ORAL | Status: AC
  Administered 2024-02-12: 100 mg via ORAL
  Filled 2024-02-12 (×2): qty 1

## 2024-02-12 MED ORDER — SODIUM CHLORIDE 0.9 % IV BOLUS
1000.0000 mL | Freq: Once | INTRAVENOUS | Status: AC
Start: 2024-02-12 — End: 2024-02-12
  Administered 2024-02-12: 1000 mL via INTRAVENOUS

## 2024-02-12 MED ORDER — FAMOTIDINE IN NACL 20-0.9 MG/50ML-% IV SOLN
20.0000 mg | Freq: Once | INTRAVENOUS | Status: AC
Start: 2024-02-12 — End: 2024-02-12
  Administered 2024-02-12: 20 mg via INTRAVENOUS
  Filled 2024-02-12: qty 50

## 2024-02-12 MED ORDER — POTASSIUM CHLORIDE CRYS ER 20 MEQ PO TBCR
40.0000 meq | EXTENDED_RELEASE_TABLET | Freq: Once | ORAL | Status: AC
  Administered 2024-02-12: 40 meq via ORAL
  Filled 2024-02-12: qty 2

## 2024-02-12 MED ORDER — ONDANSETRON 4 MG PO TBDP: 4.0000 mg | ORAL_TABLET | Freq: Three times a day (TID) | ORAL | 0 refills | Status: DC | PRN

## 2024-02-12 NOTE — ED Notes (Signed)
 This tech assisted pt. to the restroom at this time.

## 2024-02-12 NOTE — ED Notes (Signed)
 1 set of blood cultures sent to the lab with temporary sunquest label

## 2024-02-12 NOTE — Discharge Instructions (Signed)
 For the next 24 hours, stick to bland foods.  Fluids are more important than solids.  Do not use any marijuana or alcohol.  Do not drive or operate any machinery.  Rest and hydrate.  Return with any acutely worsening symptoms or any other emergency. -- RETURN PRECAUTIONS & AFTERCARE: (ENGLISH) RETURN PRECAUTIONS: Return immediately to the emergency department or see/call your doctor if you feel worse, weak or have changes in speech or vision, are short of breath, have fever, vomiting, pain, bleeding or dark stool, trouble urinating or any new issues. Return here or see/call your doctor if not improving as expected for your suspected condition. FOLLOW-UP CARE: Call your doctor and/or any doctors we referred you to for more advice and to make an appointment. Do this today, tomorrow or after the weekend. Some doctors only take PPO insurance so if you have HMO insurance you may want to contact your HMO or your regular doctor for referral to a specialist within your plan. Either way tell the doctor's office that it was a referral from the emergency department so you get the soonest possible appointment.  YOUR TEST RESULTS: Take result reports of any blood or urine tests, imaging tests and EKG's to your doctor and any referral doctor. Have any abnormal tests repeated. Your doctor or a referral doctor can let you know when this should be done. Also make sure your doctor contacts this hospital to get any test results that are not currently available such as cultures or special tests for infection and final imaging reports, which are often not available at the time you leave the ER but which may list additional important findings that are not documented on the preliminary report. BLOOD PRESSURE: If your blood pressure was greater than 120/80 have your blood pressure rechecked within 1 to 2 weeks. MEDICATION SIDE EFFECTS: Do not drive, walk, bike, take the bus, etc. if you have received or are being prescribed any  sedating medications such as those for pain or anxiety or certain antihistamines like Benadryl . If you have been give one of these here get a taxi home or have a friend drive you home. Ask your pharmacist to counsel you on potential side effects of any new medication

## 2024-02-12 NOTE — ED Notes (Signed)
 Pt given a ginger ale on ice per MD Nicholaus for PO challenge.

## 2024-02-12 NOTE — ED Triage Notes (Signed)
 Unable to obtain oral or axillary temp, rectal temp 96.51f. Pt diaphoretic in triage.

## 2024-02-12 NOTE — ED Notes (Signed)
 Patient ambulatory to bathroom in room with one person assist.

## 2024-02-12 NOTE — ED Notes (Signed)
 Pt ambulatory to bathroom in room with one person assist.

## 2024-02-12 NOTE — ED Notes (Signed)
 Patient able to drink without N/V

## 2024-02-12 NOTE — ED Notes (Signed)
PT ambulated to the bathroom.

## 2024-02-12 NOTE — ED Triage Notes (Signed)
 Pt presented to ED BIBA with c/o nausea, vomiting, and diarrhea that started approx 45 mins PTA. Pt also endorses lower abd pain. Denies dysuria, fevers. Denies sick contact.

## 2024-02-12 NOTE — ED Provider Notes (Signed)
 [facility] Provider Note    Event Date/Time   First MD Initiated Contact with Patient 02/12/24 [phone]     (approximate)   History   Emesis and Diarrhea   HPI  Hayley Robertson is a 43 y.o. female with alcohol use disorder, chronic diarrhea, ongoing tobacco use who presents to the emergency department with 4 hours of nausea vomiting and diarrhea along with generalized abdominal pain.  Patient reports that yesterday he was in normal day and she was feeling well.  She did drink some alcohol (wont quantify how much) yesterday afternoon anything.  Today she awoke with vomiting, reports over 10 episodes of nausea bloody emesis.  She subsequently developed generalized abdominal pain.  She reports worsening diarrhea and had over 3 nonbloody episodes this morning.  Denies any cough congestion fevers or chills chest pain shortness of breath, SI HI or AVHS.  She arrives by ambulance, but reports that she did not get any medications en route      Physical Exam   Triage Vital Signs: ED Triage Vitals  Encounter Vitals Group     BP 02/12/24 0646 109/69     Girls Systolic BP Percentile --      Girls Diastolic BP Percentile --      Boys Systolic BP Percentile --      Boys Diastolic BP Percentile --      Pulse Rate 02/12/24 0646 62     Resp 02/12/24 0646 (!) 22     Temp 02/12/24 0655 (!) 96.8 F (36 C)     Temp Source 02/12/24 0655 Rectal     SpO2 02/12/24 0636 100 %     Weight 02/12/24 0639 110 lb (49.9 kg)     Height 02/12/24 0639 5' 8 (1.727 m)     Head Circumference --      Peak Flow --      Pain Score 02/12/24 0639 6     Pain Loc --      Pain Education --      Exclude from Growth Chart --     Most recent vital signs: Vitals:   02/12/24 1400 02/12/24 1431  BP: 95/70   Pulse: (!) 55   Resp: 15 19  Temp:  98 F (36.7 C)  SpO2: 99%     Nursing Triage Note reviewed. Vital signs reviewed and patients oxygen saturation is normoxic  General: Patient is well  nourished, well developed, awake and alert, thin, diaphoretic Head: Normocephalic and atraumatic Eyes: Normal inspection, extraocular muscles intact, no conjunctival pallor Ear, nose, throat: Normal external exam Neck: Normal range of motion Respiratory: Patient is in no respiratory distress, lungs CTAB Cardiovascular: Patient is not tachycardic, RRR without murmur appreciated GI: Abd Soft, ttp generally, no rebound, guarding Back: Normal inspection of the back with good strength and range of motion throughout all ext Extremities: pulses intact with good cap refills, no LE pitting edema or calf tenderness Neuro: The patient is alert and oriented to person, place, and time, appropriately conversive, with 5/5 bilat UE/LE strength, no gross motor or sensory defects noted. Coordination appears to be adequate. Skin: Warm, dry, and intact Psych: normal mood and affect, no SI or HI  ED Results / Procedures / Treatments   Labs (all labs ordered are listed, but only abnormal results are displayed) Labs Reviewed  COMPREHENSIVE METABOLIC PANEL WITH GFR - Abnormal; Notable for the following components:      Result Value   Potassium 3.1 (*)  CO2 21 (*)    Glucose, Bld 132 (*)    BUN <5 (*)    Calcium 8.8 (*)    Anion gap 16 (*)    All other components within normal limits  CBC - Abnormal; Notable for the following components:   WBC 17.1 (*)    MCH 34.6 (*)    All other components within normal limits  URINALYSIS, ROUTINE W REFLEX MICROSCOPIC - Abnormal; Notable for the following components:   Color, Urine YELLOW (*)    APPearance CLEAR (*)    pH 9.0 (*)    Ketones, ur 5 (*)    All other components within normal limits  LACTIC ACID, PLASMA - Abnormal; Notable for the following components:   Lactic Acid, Venous 3.6 (*)    All other components within normal limits  LACTIC ACID, PLASMA - Abnormal; Notable for the following components:   Lactic Acid, Venous 3.1 (*)    All other components  within normal limits  URINE DRUG SCREEN, QUALITATIVE ([facility] ONLY) - Abnormal; Notable for the following components:   Cannabinoid 50 Ng, Ur [place] POSITIVE (*)    All other components within normal limits  MAGNESIUM  - Abnormal; Notable for the following components:   Magnesium  1.3 (*)    All other components within normal limits  LIPASE, BLOOD  ETHANOL  PREGNANCY, URINE  LACTIC ACID, PLASMA     EKG EKG and rhythm strip are interpreted by myself:   EKG: [Normal sinus rhythm] at heart rate of 53, normal QRS duration, QTc 529, nonspecific ST segments and T waves no ectopy EKG not consistent with Acute STEMI Rhythm strip: NSR in lead II   RADIOLOGY CT abd and pelvis with iv contrast: No acute abnormality on my independent review and interpretation but does have evidence of a left ovarian cyst that will require follow-up.     PROCEDURES:  Critical Care performed: No  Procedures   MEDICATIONS ORDERED IN ED: Medications  sodium chloride  0.9 % bolus 1,000 mL (0 mLs Intravenous Stopped 02/12/24 0856)  ondansetron  (ZOFRAN ) injection 4 mg (4 mg Intravenous Given 02/12/24 0740)  famotidine  (PEPCID ) IVPB 20 mg premix (0 mg Intravenous Stopped 02/12/24 0812)  diphenhydrAMINE  (BENADRYL ) injection 12.5 mg (12.5 mg Intravenous Given 02/12/24 0740)  potassium chloride  SA (KLOR-CON  M) CR tablet 40 mEq (40 mEq Oral Given 02/12/24 0832)  magnesium  sulfate IVPB 2 g 50 mL (0 g Intravenous Stopped 02/12/24 1032)  folic acid  (FOLVITE ) tablet 1 mg (1 mg Oral Given 02/12/24 9167)  multivitamin with minerals tablet 1 tablet (1 tablet Oral Given 02/12/24 0832)  thiamine  (VITAMIN B1) tablet 100 mg (100 mg Oral Given 02/12/24 0832)  iohexol  (OMNIPAQUE ) 300 MG/ML solution 100 mL (100 mLs Intravenous Contrast Given 02/12/24 0939)  sodium chloride  0.9 % bolus 1,000 mL (0 mLs Intravenous Stopped 02/12/24 1234)  droperidol  (INAPSINE ) 2.5 MG/ML injection 1.25 mg (1.25 mg Intravenous Given 02/12/24 1310)  diazepam  (VALIUM )  injection 2.5 mg (2.5 mg Intravenous Given 02/12/24 1158)     IMPRESSION / MDM / ASSESSMENT AND PLAN / ED COURSE                                Differential diagnosis includes, but is not limited to, gastritis, gastroenteritis, colitis, appendicitis, anemia, electrolyte derangement UTI   ED course: Patient presents and appears hypovolemic.  I am reassured that her abdomen demonstrates no evidence of peritonitis.  Currently no evidence of alcohol  withdrawal.  She does have an elevated lactate and WBC however this may be reactive to vomiting.  She has presented with gastritis in the past similarly.  However given the leukocytosis and elevated lactate we will obtain a CT abdomen pelvis.  At this time I do not think her exam is consistent with any bowel ischemia.  She was given IV fluids Zofran  Pepcid  and Benadryl  and will reassess  1:04 PM on 02/12/2024: Patient's repeat lactate has downtrended.  I did print out the CT results for the patient and we reviewed them at bedside.  She is aware of the left ovarian cyst and will arrange follow-up to obtain a repeat ultrasound in 6 months.  All questions answered patient voiced understanding and requested discharge  At time of discharge there is no evidence of acute life, limb, vision, or fertility threat. Patient has stable vital signs, pain is well controlled, patient is ambulatory and p.o. tolerant.  Discharge instructions were completed using the EPIC system. I would refer you to those at this time. All warnings prescriptions follow-up etc. were discussed in detail with the patient. Patient indicates understanding and is agreeable with this plan. All questions answered.  Patient is made aware that they may return to the emergency department for any worsening or new condition or for any other emergency.    Clinical Course as of 02/12/24 1526  Sat Feb 12, 2024  0759 Potassium(!): 3.1 Will offer repletion [HD]  0759 Lactic Acid, Venous(!!):  3.6 Possibly prerenal but she does have a leukocytosis--not tachycardic or febrile, unclear if reactive from vomitting. Will hold off on abx till obtain more info, but have a low threshold to initiate if becomes tachycardic, febirle [HD]  0819 Magnesium (!): 1.3 Will replete [HD]  0821 DG Chest 2 View No acute abnormality on my independent review and interpretation [HD]  9141 Patient reassessed, no longer vomiting abdomen remains soft.  She feels improved.  She does admit to using THC regularly and I do wonder whether her symptoms could be secondary to cannabinoid hyperemesis [HD]  0928 Cannabinoid 50 Ng, Ur [place](!): POSITIVE [HD]  1034 4.8 cm homogeneous well-defined cystic mass in the left ovary. This is probably a benign cyst. Given the history of pain, follow-up ultrasound in 3-6 months recommended to ensure resolution. 2. Trace free fluid in the pelvis.   [HD]  1034 CT ABDOMEN PELVIS W CONTRAST [HD]  1042 Patient currently p.o. trialing, awaiting repeat lactate [HD]  1046 CT ABDOMEN PELVIS W CONTRAST [HD]  1110 Lactic Acid, Venous(!!): 3.1 Still elevated will give another liter of fluid [HD]  1405 Lactic Acid, Venous: 1.6 Much improved patient has tolerated p.o. will ready for discharge [HD]    Clinical Course User Index [HD] Nicholaus Rolland BRAVO, MD   -- Risk: 5 This patient has a high risk of morbidity due to further diagnostic testing or treatment. Rationale: This patient's evaluation and management involve a high risk of morbidity due to the potential severity of presenting symptoms, need for diagnostic testing, and/or initiation of treatment that may require close monitoring. The differential includes conditions with potential for significant deterioration or requiring escalation of care. Treatment decisions in the ED, including medication administration, procedural interventions, or disposition planning, reflect this level of risk. COPA: 5 The patient has the following acute or  chronic illness/injury that poses a possible threat to life or bodily function: [X] : The patient has a potentially serious acute condition or an acute exacerbation of a chronic illness requiring urgent evaluation  and management in the Emergency Department. The clinical presentation necessitates immediate consideration of life-threatening or function-threatening diagnoses, even if they are ultimately ruled out.   FINAL CLINICAL IMPRESSION(S) / ED DIAGNOSES   Final diagnoses:  Nausea and vomiting, unspecified vomiting type  Diarrhea, unspecified type  Tetrahydrocannabinol (THC) use disorder, mild, abuse     Rx / DC Orders   ED Discharge Orders          Ordered    ondansetron  (ZOFRAN -ODT) 4 MG disintegrating tablet  Every 8 hours PRN        02/12/24 1407             Note:  This document was prepared using Dragon voice recognition software and may include unintentional dictation errors.   Nicholaus Rolland BRAVO, MD 02/12/24 [phone]

## 2024-05-23 ENCOUNTER — Emergency Department
Admission: EM | Admit: 2024-05-23 | Discharge: 2024-05-23 | Disposition: A | Discharge status: Home / Self Care | Attending: Emergency Medicine | Admitting: Emergency Medicine

## 2024-05-23 DIAGNOSIS — R112 Nausea with vomiting, unspecified: Secondary | ICD-10-CM | POA: Diagnosis present

## 2024-05-23 LAB — CBC
HCT: 42.4 % (ref 36.0–46.0)
Hemoglobin: 14.4 g/dL (ref 12.0–15.0)
MCH: 30.8 pg (ref 26.0–34.0)
MCHC: 34 g/dL (ref 30.0–36.0)
MCV: 90.6 fL (ref 80.0–100.0)
Platelets: 341 K/uL (ref 150–400)
RBC: 4.68 MIL/uL (ref 3.87–5.11)
RDW: 12.8 % (ref 11.5–15.5)
WBC: 15.8 K/uL — ABNORMAL HIGH (ref 4.0–10.5)
nRBC: 0 % (ref 0.0–0.2)

## 2024-05-23 LAB — URINALYSIS, ROUTINE W REFLEX MICROSCOPIC
Bilirubin Urine: NEGATIVE
Glucose, UA: NEGATIVE mg/dL
Hgb urine dipstick: NEGATIVE
Ketones, ur: NEGATIVE mg/dL
Leukocytes,Ua: NEGATIVE
Nitrite: NEGATIVE
Protein, ur: 30 mg/dL — AB
Specific Gravity, Urine: 1.017 (ref 1.005–1.030)
pH: 7 (ref 5.0–8.0)

## 2024-05-23 LAB — COMPREHENSIVE METABOLIC PANEL WITH GFR
ALT: 14 U/L (ref 0–44)
AST: 26 U/L (ref 15–41)
Albumin: 4.1 g/dL (ref 3.5–5.0)
Alkaline Phosphatase: 72 U/L (ref 38–126)
Anion gap: 16 — ABNORMAL HIGH (ref 5–15)
BUN: 6 mg/dL (ref 6–20)
CO2: 17 mmol/L — ABNORMAL LOW (ref 22–32)
Calcium: 9.5 mg/dL (ref 8.9–10.3)
Chloride: 107 mmol/L (ref 98–111)
Creatinine, Ser: 0.65 mg/dL (ref 0.44–1.00)
GFR, Estimated: >60 mL/min
Glucose, Bld: 136 mg/dL — ABNORMAL HIGH (ref 70–99)
Potassium: 3.7 mmol/L (ref 3.5–5.1)
Sodium: 141 mmol/L (ref 135–145)
Total Bilirubin: 0.5 mg/dL (ref 0.0–1.2)
Total Protein: 6.6 g/dL (ref 6.5–8.1)

## 2024-05-23 LAB — POC URINE PREG, ED: Preg Test, Ur: NEGATIVE

## 2024-05-23 LAB — LIPASE, BLOOD: Lipase: 36 U/L (ref 11–51)

## 2024-05-23 LAB — TSH: TSH: 4.18 u[IU]/mL (ref 0.350–4.500)

## 2024-05-23 LAB — MAGNESIUM: Magnesium: 1.7 mg/dL (ref 1.7–2.4)

## 2024-05-23 LAB — T4, FREE: Free T4: 1.43 ng/dL (ref 0.80–2.00)

## 2024-05-23 MED ORDER — ONDANSETRON 4 MG PO TBDP
4.0000 mg | ORAL_TABLET | Freq: Once | ORAL | Status: AC
  Administered 2024-05-23: 4 mg via ORAL
  Filled 2024-05-23: qty 1

## 2024-05-23 MED ORDER — LACTATED RINGERS IV BOLUS
1000.0000 mL | Freq: Once | INTRAVENOUS | Status: AC
  Administered 2024-05-23: 1000 mL via INTRAVENOUS

## 2024-05-23 MED ORDER — ONDANSETRON 4 MG PO TBDP: 4.0000 mg | ORAL_TABLET | Freq: Three times a day (TID) | ORAL | 0 refills | Status: AC | PRN

## 2024-05-23 NOTE — Discharge Instructions (Addendum)
 You are seen in the emergency department for nausea vomiting and diarrhea.  You had findings concerning for dehydration.  You were given IV fluids.  You are given a prescription for nausea medication.  Is importantly stay hydrated and drink plenty of fluids.  Call your primary care physician today to schedule close follow-up appointment so they can repeat your lab work.  Return to the emergency department you have any worsening symptoms.  zofran  (ondansetron ) - nausea medication, take 1 tablet every 8 hours as needed for nausea/vomiting. You can take one time dose of a second tablet in 8 hours,

## 2024-05-23 NOTE — ED Notes (Signed)
 Pt given DC instructions. Pt verbalized understanding of medication and follow up care. Pt taken from ED in wheelchair by family.

## 2024-05-23 NOTE — ED Provider Notes (Signed)
 "  [facility] Provider Note    Event Date/Time   First MD Initiated Contact with Patient 05/23/24 [id number]     (approximate)   History   Emesis   HPI  Hayley Robertson is a 43 y.o. female presents to the emergency department with nausea, vomiting and diarrhea.  Patient states that she has not been feeling well for the past 2 days.  States that she has had difficulty keeping anything down.  Endorses chills and has been feeling very cold.  No documented fever.  States that she has not drink any alcohol for the past 3 months.  Denies any abdominal pain.  Denies any significant cough or shortness of breath.  Denies any dysuria, urinary urgency or frequency.     Physical Exam   Triage Vital Signs: ED Triage Vitals  Encounter Vitals Group     BP 05/23/24 0921 132/78     Girls Systolic BP Percentile --      Girls Diastolic BP Percentile --      Boys Systolic BP Percentile --      Boys Diastolic BP Percentile --      Pulse Rate 05/23/24 0921 61     Resp 05/23/24 0921 20     Temp 05/23/24 0921 97.7 F (36.5 C)     Temp Source 05/23/24 0921 Oral     SpO2 05/23/24 0921 100 %     Weight 05/23/24 0924 113 lb (51.3 kg)     Height 05/23/24 0924 5' 8 (1.727 m)     Head Circumference --      Peak Flow --      Pain Score 05/23/24 0922 10     Pain Loc --      Pain Education --      Exclude from Growth Chart --     Most recent vital signs: Vitals:   05/23/24 0956 05/23/24 1137  BP:  134/73  Pulse:  (!) 50  Resp:  19  Temp:  97.7 F (36.5 C)  SpO2: 100% 100%    Physical Exam Constitutional:      Appearance: She is well-developed.  HENT:     Head: Atraumatic.     Mouth/Throat:     Mouth: Mucous membranes are dry.  Eyes:     Conjunctiva/sclera: Conjunctivae normal.  Cardiovascular:     Rate and Rhythm: Regular rhythm.  Pulmonary:     Effort: No respiratory distress.  Abdominal:     General: There is no distension.     Tenderness: There is no abdominal  tenderness.  Musculoskeletal:        General: Normal range of motion.     Cervical back: Normal range of motion.     Right lower leg: No edema.     Left lower leg: No edema.  Skin:    General: Skin is warm.  Neurological:     Mental Status: She is alert. Mental status is at baseline.      IMPRESSION / MDM / ASSESSMENT AND PLAN / ED COURSE  I reviewed the triage vital signs and the nursing notes.  Differential diagnosis including pancreatitis, viral gastroenteritis, viral illness including COVID/influenza, electrolyte abnormality, dehydration  On chart review had a history of prolonged QTc on prior EKG, plan to reevaluate for antiemetic use     Labs (all labs ordered are listed, but only abnormal results are displayed) Labs interpreted as -    Labs Reviewed  COMPREHENSIVE METABOLIC PANEL WITH GFR - Abnormal;  Notable for the following components:      Result Value   CO2 17 (*)    Glucose, Bld 136 (*)    Anion gap 16 (*)    All other components within normal limits  CBC - Abnormal; Notable for the following components:   WBC 15.8 (*)    All other components within normal limits  URINALYSIS, ROUTINE W REFLEX MICROSCOPIC - Abnormal; Notable for the following components:   Color, Urine YELLOW (*)    APPearance HAZY (*)    Protein, ur 30 (*)    Bacteria, UA RARE (*)    All other components within normal limits  LIPASE, BLOOD  MAGNESIUM   TSH  T4, FREE  POC URINE PREG, ED   My interpretation of EKG with no longer having a prolonged QT interval does have significant bradycardia but no signs of heart block and no prolonged QT interval.  No significant ST elevation or depression.  Patient was given IV fluids given findings concerning for significant dehydration on her lab work.  Given IV Zofran   No findings of acute pancreatitis, pregnancy test is negative with no signs of urinary tract infection.  Patient does have a mildly elevated anion gap of 16 and acidotic at 17.   Glucose is normal at 136 and does not take any diabetic medications.  Creatinine is at her baseline.  On reevaluation patient has had bradycardia in the past, repeat vital signs bradycardia and heart rate improved to the mid 50s and 60s.  Patient was able to tolerate p.o. with no further episodes of nausea or vomiting and states she is feeling much better.  Repeat abdominal exam with no focal abdominal tenderness to palpation and no rebound or guarding.  Patient most likely with a viral illness.  Lower suspicion for alcohol withdrawal given that she has not had any alcohol in the past 3 months and no tachycardia or hypertension.  Will give a prescription for antiemetics.  Discussed oral hydration.  Discussed calling primary care physician for close follow-up and repeat lab work.  Discussed return immediately to the emergency department if she had any worsening symptoms or signs of dehydration or further episodes of vomiting.  Patient states that she expressed understanding, no questions or concerns at time of discharge.      PROCEDURES:  Critical Care performed: No  Procedures  Patient's presentation is most consistent with acute presentation with potential threat to life or bodily function.   MEDICATIONS ORDERED IN ED: Medications  ondansetron  (ZOFRAN -ODT) disintegrating tablet 4 mg (4 mg Oral Given 05/23/24 1018)  lactated ringers  bolus 1,000 mL (0 mLs Intravenous Stopped 05/23/24 1155)    FINAL CLINICAL IMPRESSION(S) / ED DIAGNOSES   Final diagnoses:  Nausea and vomiting, unspecified vomiting type     Rx / DC Orders   ED Discharge Orders          Ordered    ondansetron  (ZOFRAN -ODT) 4 MG disintegrating tablet  Every 8 hours PRN        05/23/24 1148             Note:  This document was prepared using Dragon voice recognition software and may include unintentional dictation errors.   Suzanne Kirsch, MD 05/23/24 [phone]  "

## 2024-05-23 NOTE — ED Triage Notes (Signed)
 Pt presents to the ED via POV from home with N/V/D and chills since Sunday. Pt A&Ox4. Actively vomiting in triage.
